# Patient Record
Sex: Male | Born: 1975 | Race: Black or African American | Hispanic: No | Marital: Single | Smoking: Former smoker
Health system: Southern US, Community
[De-identification: ages and names within clinical notes are randomized; demographics above are authoritative.]

## PROBLEM LIST (undated history)

## (undated) DIAGNOSIS — L309 Dermatitis, unspecified: Secondary | ICD-10-CM

## (undated) DIAGNOSIS — L409 Psoriasis, unspecified: Secondary | ICD-10-CM

---

## 2012-12-05 ENCOUNTER — Emergency Department (HOSPITAL_COMMUNITY)
Admission: EM | Admit: 2012-12-05 | Discharge: 2012-12-05 | Disposition: A | Discharge status: Home / Self Care | Payer: Self-pay | Attending: Emergency Medicine | Admitting: Emergency Medicine

## 2012-12-05 ENCOUNTER — Encounter (HOSPITAL_COMMUNITY): Payer: Self-pay | Admitting: *Deleted

## 2012-12-05 DIAGNOSIS — B86 Scabies: Secondary | ICD-10-CM | POA: Insufficient documentation

## 2012-12-05 MED ORDER — PERMETHRIN 5 % EX CREA: TOPICAL_CREAM | CUTANEOUS | Status: DC

## 2012-12-05 MED ORDER — HYDROXYZINE HCL 25 MG PO TABS: 25.0000 mg | ORAL_TABLET | Freq: Four times a day (QID) | ORAL | Status: DC

## 2012-12-05 NOTE — ED Notes (Signed)
Rash, itching, redness since October 2013 while incarcerated and diagnosed in January and received 2 oral treatments with last dose 2 weeks ago 11/16/12. Recurrence of sx in  Last week with increased itching, redness all over from head to toe.

## 2012-12-05 NOTE — ED Provider Notes (Signed)
Medical screening examination/treatment/procedure(s) were performed by non-physician practitioner and as supervising physician I was immediately available for consultation/collaboration.  Olivia Mackie, MD 12/05/12 [phone]

## 2012-12-05 NOTE — ED Provider Notes (Signed)
History     CSN: [id number]  Arrival date & time 12/05/12  4098   First MD Initiated Contact with Patient 12/05/12 [phone]      Chief Complaint  Patient presents with  . Pruritis    (Consider location/radiation/quality/duration/timing/severity/associated sxs/prior treatment) HPI  Brian Krause is a 37 y.o. male complaining of diffuse itching starting several months ago while in jail. Patient was recently diagnosed with scabies and received 2 oral treatments. His recurrence of symptoms in the last week. Patient reports a worsening at nighttime. Denies any new exposures, fever, cough, chest pain, abdominal pain, nausea vomiting, change of bowel or bladder habits.  No past medical history on file.  No past surgical history on file.  No family history on file.  History  Substance Use Topics  . Smoking status: Not on file  . Smokeless tobacco: Not on file  . Alcohol Use: Not on file      Review of Systems  Constitutional: Negative for fever.  Respiratory: Negative for shortness of breath.   Cardiovascular: Negative for chest pain.  Gastrointestinal: Negative for nausea, vomiting, abdominal pain and diarrhea.  Skin: Positive for wound.  All other systems reviewed and are negative.    Allergies  Review of patient's allergies indicates no known allergies.  Home Medications  No current outpatient prescriptions on file.  BP 172/74  Pulse 94  Temp 98.7 F (37.1 C) (Oral)  SpO2 97%  Physical Exam  Nursing note and vitals reviewed. Constitutional: He is oriented to person, place, and time. He appears well-developed and well-nourished. No distress.  HENT:  Head: Normocephalic.  Eyes: Conjunctivae normal and EOM are normal.  Neck: Normal range of motion. Neck supple.  Cardiovascular: Normal rate.   Pulmonary/Chest: Effort normal. No stridor.  Abdominal: Soft. Bowel sounds are normal.  Musculoskeletal: Normal range of motion.  Neurological: He is alert and oriented to  person, place, and time.  Skin:       Very dry and flaking in general. Multiple areas of excoriation with no signs of infection. Several linear burrows noted.   Psychiatric: He has a normal mood and affect.    ED Course  Procedures (including critical care time)  Labs Reviewed - No data to display No results found.   1. Scabies infestation       MDM   Pt verbalized understanding and agrees with care plan. Outpatient follow-up and return precautions given.    New Prescriptions   HYDROXYZINE (ATARAX/VISTARIL) 25 MG TABLET    Take 1 tablet (25 mg total) by mouth every 6 (six) hours.   PERMETHRIN (ELIMITE) 5 % CREAM    Apply to from neck to toes, even under fingernails. Apply before bedtime, leave on 12 hours and rinse off in the morning. If you are still itching in 14 days from application, please repeat          Wynetta Emery, PA-C 12/05/12 [phone]

## 2014-04-03 ENCOUNTER — Encounter (HOSPITAL_COMMUNITY): Payer: Self-pay | Admitting: Emergency Medicine

## 2014-04-03 ENCOUNTER — Emergency Department (HOSPITAL_COMMUNITY)
Admission: EM | Admit: 2014-04-03 | Discharge: 2014-04-03 | Disposition: A | Discharge status: Home / Self Care | Payer: Self-pay | Attending: Emergency Medicine | Admitting: Emergency Medicine

## 2014-04-03 DIAGNOSIS — R21 Rash and other nonspecific skin eruption: Secondary | ICD-10-CM | POA: Insufficient documentation

## 2014-04-03 DIAGNOSIS — L089 Local infection of the skin and subcutaneous tissue, unspecified: Secondary | ICD-10-CM | POA: Insufficient documentation

## 2014-04-03 DIAGNOSIS — M7989 Other specified soft tissue disorders: Secondary | ICD-10-CM | POA: Insufficient documentation

## 2014-04-03 DIAGNOSIS — Z87891 Personal history of nicotine dependence: Secondary | ICD-10-CM | POA: Insufficient documentation

## 2014-04-03 MED ORDER — SILVER SULFADIAZINE 1 % EX CREA
TOPICAL_CREAM | Freq: Once | CUTANEOUS | Status: AC
  Administered 2014-04-03: 1 via TOPICAL
  Filled 2014-04-03: qty 50

## 2014-04-03 MED ORDER — CEPHALEXIN 250 MG PO CAPS
250.0000 mg | ORAL_CAPSULE | Freq: Once | ORAL | Status: AC
  Administered 2014-04-03: 250 mg via ORAL
  Filled 2014-04-03: qty 1

## 2014-04-03 MED ORDER — SILVER SULFADIAZINE 1 % EX CREA: 1.0000 "application " | TOPICAL_CREAM | Freq: Two times a day (BID) | CUTANEOUS | Status: DC

## 2014-04-03 MED ORDER — CEPHALEXIN 500 MG PO CAPS: 500.0000 mg | ORAL_CAPSULE | Freq: Four times a day (QID) | ORAL | Status: DC

## 2014-04-03 NOTE — ED Provider Notes (Signed)
Medical screening examination/treatment/procedure(s) were performed by non-physician practitioner and as supervising physician I was immediately available for consultation/collaboration.   EKG Interpretation None      Devoria Albe, MD, Armando Gang   Ward Givens, MD 04/03/14 [phone]

## 2014-04-03 NOTE — Discharge Instructions (Signed)
Wound Care Wound care helps prevent pain and infection.  You may need a tetanus shot if:  You cannot remember when you had your last tetanus shot.  You have never had a tetanus shot.  The injury broke your skin. If you need a tetanus shot and you choose not to have one, you may get tetanus. Sickness from tetanus can be serious. HOME CARE   Only take medicine as told by your doctor.  Clean the wound daily with mild soap and water.  Change any bandages (dressings) as told by your doctor.  Put medicated cream and a bandage on the wound as told by your doctor.  Change the bandage if it gets wet, dirty, or starts to smell.  Take showers. Do not take baths, swim, or do anything that puts your wound under water.  Rest and raise (elevate) the wound until the pain and puffiness (swelling) are better.  Keep all doctor visits as told. GET HELP RIGHT AWAY IF:   Yellowish-white fluid (pus) comes from the wound.  Medicine does not lessen your pain.  There is a red streak going away from the wound.  You have a fever. MAKE SURE YOU:   Understand these instructions.  Will watch your condition.  Will get help right away if you are not doing well or get worse. Document Released: 08/03/2008 Document Revised: 01/17/2012 Document Reviewed: 02/28/2011 Exit[employer] Patient Information 2014 [employer].  

## 2014-04-03 NOTE — Progress Notes (Signed)
[facility] provided pt with a list of primary care resources and a [facility] [employer] Card application to help patient establish a pcp.  °

## 2014-04-03 NOTE — ED Provider Notes (Signed)
CSN: [id number]     Arrival date & time 04/03/14  1351 History  This chart was scribed for non-physician practitioner, Elpidio Anis, PA-C working with Ward Givens, MD by Greggory Stallion, ED scribe. This patient was seen in room WTR7/WTR7 and the patient's care was started at 2:42 PM.   Chief Complaint  Patient presents with  . Wound Infection   The history is provided by the patient. No language interpreter was used.   HPI Comments: Brian Krause is a 38 y.o. 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 He states it has been draining for the last 2 months and gradually worsened. It has gone from clear, to yellow, to green. States he also has swelling and pain in his right leg.   History reviewed. No pertinent past medical history. History reviewed. No pertinent past surgical history. No family history on file. History  Substance Use Topics  . Smoking status: Former Games developer  . Smokeless tobacco: Not on file  . Alcohol Use: Yes    Review of Systems  Cardiovascular: Positive for leg swelling.  Musculoskeletal: Positive for myalgias.  Skin: Positive for rash.  All other systems reviewed and are negative.  Allergies  Review of patient's allergies indicates no known allergies.  Home Medications   Prior to Admission medications   Medication Sig Start Date End Date Taking? Authorizing Provider  hydrOXYzine (ATARAX/VISTARIL) 25 MG tablet Take 1 tablet (25 mg total) by mouth every 6 (six) hours. 12/05/12   Nicole Pisciotta, PA-C  permethrin (ELIMITE) 5 % cream Apply to from neck to toes, even under fingernails. Apply before bedtime, leave on 12 hours and rinse off in the morning. If you are still itching in 14 days from application, please repeat 12/05/12   Joni Reining Pisciotta, PA-C   BP 124/73  Pulse 86  Temp(Src) 98.6 F (37 C) (Oral)  Resp 18  SpO2 100%  Physical Exam  Nursing note and vitals reviewed. Constitutional: He is oriented to  person, place, and time. He appears well-developed and well-nourished. No distress.  HENT:  Head: Normocephalic and atraumatic.  Eyes: EOM are normal.  Neck: Neck supple. No tracheal deviation present.  Cardiovascular: Normal rate.   Pulmonary/Chest: Effort normal. No respiratory distress.  Musculoskeletal: Normal range of motion.  Neurological: He is alert and oriented to person, place, and time.  Skin: Skin is warm and dry.  Extensive, shallow bilateral lower extremity ulcerations over posterior and lateral legs. No bleeding. Malodorous. There is minimal musculoskeletal swelling. No redness to calves at site of ulcerations or extending proximally.  Psychiatric: He has a normal mood and affect. His behavior is normal.    ED Course  Procedures (including critical care time)  DIAGNOSTIC STUDIES: Oxygen Saturation is 100% on RA, normal by my interpretation.    COORDINATION OF CARE: 2:45 PM-Discu  Labs Review Labs Reviewed - No data to display  Imaging Review No results found.   EKG Interpretation None      MDM   Final diagnoses:  None    1. Lower extremity ulcerations  Proper wound care instructions given. Wounds cleaned and re-bandaged. Silvadene and oral abx given. He is instructed to return in 2 days for recheck.   I personally performed the services described in this documentation, which was scribed in my presence. The recorded information has been reviewed and is accurate.  Arnoldo Hooker, PA-C 04/03/14 1554

## 2014-04-03 NOTE — ED Notes (Addendum)
Pt reports that he has had an psoriasis to the left lower leg, which has been draining for the past three months. Pt states the drainage has progressed from clear, to yellow, to green. Pt has not been evaluated for this before. Pt now reports right leg swelling and pain, which has same drainage as the left. Pt presents with bilateral legs dressed in gauze, which are visibly soiled with drainage. Pt states he is unable to bear weight on the right leg. Pt is A/O x4 and vitals are WDL.

## 2017-04-08 ENCOUNTER — Emergency Department (HOSPITAL_BASED_OUTPATIENT_CLINIC_OR_DEPARTMENT_OTHER)
Admission: EM | Admit: 2017-04-08 | Discharge: 2017-04-08 | Disposition: A | Discharge status: Home / Self Care | Payer: Self-pay | Attending: Emergency Medicine | Admitting: Emergency Medicine

## 2017-04-08 ENCOUNTER — Encounter (HOSPITAL_BASED_OUTPATIENT_CLINIC_OR_DEPARTMENT_OTHER): Payer: Self-pay | Admitting: Emergency Medicine

## 2017-04-08 ENCOUNTER — Emergency Department (HOSPITAL_BASED_OUTPATIENT_CLINIC_OR_DEPARTMENT_OTHER): Payer: Self-pay

## 2017-04-08 DIAGNOSIS — Y939 Activity, unspecified: Secondary | ICD-10-CM | POA: Insufficient documentation

## 2017-04-08 DIAGNOSIS — Y929 Unspecified place or not applicable: Secondary | ICD-10-CM | POA: Insufficient documentation

## 2017-04-08 DIAGNOSIS — Z87891 Personal history of nicotine dependence: Secondary | ICD-10-CM | POA: Insufficient documentation

## 2017-04-08 DIAGNOSIS — S8392XA Sprain of unspecified site of left knee, initial encounter: Secondary | ICD-10-CM | POA: Insufficient documentation

## 2017-04-08 DIAGNOSIS — X500XXA Overexertion from strenuous movement or load, initial encounter: Secondary | ICD-10-CM | POA: Insufficient documentation

## 2017-04-08 DIAGNOSIS — Y999 Unspecified external cause status: Secondary | ICD-10-CM | POA: Insufficient documentation

## 2017-04-08 MED ORDER — IBUPROFEN 600 MG PO TABS: 600.0000 mg | ORAL_TABLET | Freq: Four times a day (QID) | ORAL | 0 refills | Status: AC | PRN

## 2017-04-08 NOTE — ED Notes (Signed)
PMS intact before and after. Pt tolerated well. All questions answered. 

## 2017-04-08 NOTE — ED Triage Notes (Signed)
PT presents to ED with c/o left knee pain for 2 days without injury.

## 2017-04-08 NOTE — ED Provider Notes (Signed)
[facility]-EMERGENCY DEPT Provider Note   CSN: [id number] Arrival date & time: 04/08/17  2008  By signing my name below, I, Cynda AcresHailei Fulton, attest that this documentation has been prepared under the direction and in the presence of Arby BarrettePfeiffer, Margaree Sandhu, MD. Electronically Signed: Cynda AcresHailei Fulton, Scribe. 04/08/17. 9:21 PM.  History   Chief Complaint Chief Complaint  Patient presents with  . Knee Pain   HPI Comments: Brian Krause is a 41 y.o. male with no pertinent past medical history, who presents to the Emergency Department complaining of sudden-onset, constant left knee pain that began two days ago. Patient stets he went to sleep on an air mattress, he woke up and began having pain to the left knee. Patient states his pain progressively worsened yesterday. Patient reports applying icy/hot and ice. Patient is ambulatory in the emergency department. Patient denies any numbness, weakness, nausea, vomiting, fever, or chills.   The history is provided by the patient. No language interpreter was used.    History reviewed. No pertinent past medical history.  There are no active problems to display for this patient.   History reviewed. No pertinent surgical history.     Home Medications    Prior to Admission medications   Medication Sig Start Date End Date Taking? Authorizing Provider  cephALEXin (KEFLEX) 500 MG capsule Take 1 capsule (500 mg total) by mouth 4 (four) times daily. 04/03/14   Elpidio AnisUpstill, Shari, PA-C  HYDROCODONE-ACETAMINOPHEN PO Take 1 tablet by mouth once.    [provider]  ibuprofen (ADVIL,MOTRIN) 600 MG tablet Take 1 tablet (600 mg total) by mouth every 6 (six) hours as needed. 04/08/17   Arby BarrettePfeiffer, Zala Degrasse, MD  silver sulfADIAZINE (SILVADENE) 1 % cream Apply 1 application topically 2 (two) times daily. 04/03/14   Elpidio AnisUpstill, Shari, PA-C    Family History No family history on file.  Social History Social History  Substance Use Topics  . Smoking status: Former Games developermoker  .  Smokeless tobacco: Not on file  . Alcohol use Yes     Allergies   Patient has no known allergies.   Review of Systems Review of Systems  Constitutional: Negative for chills and fever.  Gastrointestinal: Negative for nausea and vomiting.  Musculoskeletal: Positive for arthralgias (left knee). Negative for gait problem and joint swelling.  Neurological: Negative for weakness.     Physical Exam Updated Vital Signs BP 115/79 (BP Location: Right Arm)   Pulse 84   Temp 98.3 F (36.8 C) (Oral)   Resp 16   SpO2 98%   Physical Exam  Constitutional: He is oriented to person, place, and time. He appears well-developed.  HENT:  Head: Normocephalic and atraumatic.  Mouth/Throat: Oropharynx is clear and moist.  Eyes: Conjunctivae and EOM are normal. Pupils are equal, round, and reactive to light.  Neck: Normal range of motion. Neck supple.  Cardiovascular: Normal rate and regular rhythm.   Pulmonary/Chest: Effort normal and breath sounds normal.  Abdominal: Soft. Bowel sounds are normal.  Musculoskeletal: Normal range of motion. He exhibits tenderness. He exhibits no edema or deformity.  No joint effusion. Pain with ROM beyond 45 degrees. No peripheral edema. Calf is soft and non-tender. No erythema. Skin is normal. No abrasions. Non-tender in the popliteal fossa.   Neurological: He is alert and oriented to person, place, and time.  Skin: Skin is warm and dry.  Psychiatric: He has a normal mood and affect.  Nursing note and vitals reviewed.    ED Treatments / Results  DIAGNOSTIC STUDIES: Oxygen Saturation  is 100% on RA, normal by my interpretation.    COORDINATION OF CARE: 9:21 PM Discussed treatment plan with pt at bedside and pt agreed to plan, which includes a knee brace.    Labs (all labs ordered are listed, but only abnormal results are displayed) Labs Reviewed - No data to display  EKG  EKG Interpretation None       Radiology No results  found.  Procedures Procedures (including critical care time)  Medications Ordered in ED Medications - No data to display   Initial Impression / Assessment and Plan / ED Course  I have reviewed the triage vital signs and the nursing notes.  Pertinent labs & imaging results that were available during my care of the patient were reviewed by me and considered in my medical decision making (see chart for details).      Final Clinical Impressions(s) / ED Diagnoses   Final diagnoses:  Sprain of left knee, unspecified ligament, initial encounter    New Prescriptions Discharge Medication List as of 04/08/2017  9:18 PM    START taking these medications   Details  ibuprofen (ADVIL,MOTRIN) 600 MG tablet Take 1 tablet (600 mg total) by mouth every 6 (six) hours as needed., Starting Fri 04/08/2017, Print           Arby Barrette, MD 04/13/17 1601

## 2017-04-08 NOTE — ED Notes (Signed)
EDP into room, prior to RN assessment, see MD notes, orders received and initated.  Alert, NAD, calm, interactive, resps e/u, speaking in clear complete sentences, no dyspnea noted, skin W&D, VSS, c/o L knee pain, onset Wednesday morning, some relief with advil, no known injury, waits tables for work (denies: fall, injury, numbness, tingling, weakness, fever, heat to knee, swelling, sob, nausea, dizziness or visual changes).   CMS, ROM, skin intact.

## 2017-08-23 ENCOUNTER — Emergency Department (HOSPITAL_COMMUNITY)
Admission: EM | Admit: 2017-08-23 | Discharge: 2017-08-23 | Disposition: A | Discharge status: Home / Self Care | Payer: Self-pay | Attending: Emergency Medicine | Admitting: Emergency Medicine

## 2017-08-23 ENCOUNTER — Encounter (HOSPITAL_COMMUNITY): Payer: Self-pay | Admitting: *Deleted

## 2017-08-23 DIAGNOSIS — Z87891 Personal history of nicotine dependence: Secondary | ICD-10-CM | POA: Insufficient documentation

## 2017-08-23 DIAGNOSIS — Z202 Contact with and (suspected) exposure to infections with a predominantly sexual mode of transmission: Secondary | ICD-10-CM | POA: Insufficient documentation

## 2017-08-23 DIAGNOSIS — R300 Dysuria: Secondary | ICD-10-CM | POA: Insufficient documentation

## 2017-08-23 DIAGNOSIS — Z79899 Other long term (current) drug therapy: Secondary | ICD-10-CM | POA: Insufficient documentation

## 2017-08-23 LAB — URINALYSIS, ROUTINE W REFLEX MICROSCOPIC
Bilirubin Urine: NEGATIVE
GLUCOSE, UA: NEGATIVE mg/dL
Ketones, ur: NEGATIVE mg/dL
Leukocytes, UA: NEGATIVE
Nitrite: NEGATIVE
PH: 5 (ref 5.0–8.0)
Protein, ur: NEGATIVE mg/dL
SPECIFIC GRAVITY, URINE: 1.014 (ref 1.005–1.030)

## 2017-08-23 MED ORDER — CEFTRIAXONE SODIUM 250 MG IJ SOLR
250.0000 mg | Freq: Once | INTRAMUSCULAR | Status: AC
  Administered 2017-08-23: 250 mg via INTRAMUSCULAR
  Filled 2017-08-23: qty 250

## 2017-08-23 MED ORDER — AZITHROMYCIN 250 MG PO TABS
1000.0000 mg | ORAL_TABLET | Freq: Once | ORAL | Status: AC
  Administered 2017-08-23: 1000 mg via ORAL
  Filled 2017-08-23: qty 4

## 2017-08-23 MED ORDER — LIDOCAINE HCL (PF) 1 % IJ SOLN
INTRAMUSCULAR | Status: AC
  Administered 2017-08-23: 5 mL
  Filled 2017-08-23: qty 5

## 2017-08-23 NOTE — ED Triage Notes (Addendum)
To ED for treatment after being made aware he was exposed to gonorrhea. No symptoms until noticed some burning this am with urination

## 2017-08-23 NOTE — ED Provider Notes (Signed)
[facility] EMERGENCY DEPARTMENT Provider Note   CSN: [id number] Arrival date & time: 08/23/17  1552     History   Chief Complaint Chief Complaint  Patient presents with  . Exposure to STD    HPI Brian Krause is a 41 y.o. male.  HPI 41 year old African-American male with no significant past medical history presents to the ED for treatment of STD. Patient states that his sexual partner made him aware that she was diagnosed and treated for gonorrhea. Patient denies any penile discharge or testicular pain or swelling. States that he is had no symptoms until he noticed some burning this a.m with urination after he was told he was exposed to gonorrhea.patient has sex active with multiple partners. Denies using protection. Denies any associated abdominal pain, nausea, emesis, fever. History reviewed. No pertinent past medical history.  There are no active problems to display for this patient.   History reviewed. No pertinent surgical history.     Home Medications    Prior to Admission medications   Medication Sig Start Date End Date Taking? Authorizing Provider  cephALEXin (KEFLEX) 500 MG capsule Take 1 capsule (500 mg total) by mouth 4 (four) times daily. 04/03/14   Elpidio Anis, PA-C  HYDROCODONE-ACETAMINOPHEN PO Take 1 tablet by mouth once.    [provider]  ibuprofen (ADVIL,MOTRIN) 600 MG tablet Take 1 tablet (600 mg total) by mouth every 6 (six) hours as needed. 04/08/17   Arby Barrette, MD  silver sulfADIAZINE (SILVADENE) 1 % cream Apply 1 application topically 2 (two) times daily. 04/03/14   Elpidio Anis, PA-C    Family History No family history on file.  Social History Social History  Substance Use Topics  . Smoking status: Former Games developer  . Smokeless tobacco: Not on file  . Alcohol use Yes     Allergies   Patient has no known allergies.   Review of Systems Review of Systems  Constitutional: Negative for chills and fever.    Gastrointestinal: Negative for abdominal pain, nausea and vomiting.  Genitourinary: Positive for dysuria. Negative for discharge, flank pain, hematuria, penile pain, penile swelling, scrotal swelling, testicular pain and urgency.  Skin: Negative for rash.     Physical Exam Updated Vital Signs BP 112/77 (BP Location: Right Arm)   Pulse 75   Temp 98.1 F (36.7 C) (Oral)   Resp 16   Ht  (1.651 m)   Wt 61.7 kg (136 lb)   SpO2 100%   BMI 22.63 kg/m   Physical Exam  Constitutional: He appears well-developed and well-nourished. No distress.  HENT:  Head: Normocephalic and atraumatic.  Eyes: Right eye exhibits no discharge. Left eye exhibits no discharge. No scleral icterus.  Neck: Normal range of motion.  Pulmonary/Chest: No respiratory distress.  Abdominal: Soft. Bowel sounds are normal. There is no tenderness. There is no rebound and no guarding.  Genitourinary:  Genitourinary Comments: Chaperone present for exam. Circumcised male. No penile discharge, erythema, tenderness, lesion, or rash. 2 descended testes without swelling, pain, lesions or rash. No inguinal lymphadenopathy or hernia.    Musculoskeletal: Normal range of motion.  Neurological: He is alert.  Skin: No pallor.  Psychiatric: His behavior is normal. Judgment and thought content normal.  Nursing note and vitals reviewed.    ED Treatments / Results  Labs (all labs ordered are listed, but only abnormal results are displayed) Labs Reviewed  URINALYSIS, ROUTINE W REFLEX MICROSCOPIC  GC/CHLAMYDIA PROBE AMP ([place]) NOT AT [facility]  EKG  EKG Interpretation None       Radiology No results found.  Procedures Procedures (including critical care time)  Medications Ordered in ED Medications  cefTRIAXone (ROCEPHIN) injection 250 mg (not administered)  azithromycin (ZITHROMAX) tablet 1,000 mg (not administered)     Initial Impression / Assessment and Plan / ED Course  I have reviewed the triage  vital signs and the nursing notes.  Pertinent labs & imaging results that were available during my care of the patient were reviewed by me and considered in my medical decision making (see chart for details).     Patient treated in the ED for STI with azithromycin and Rocephin. Patient does report some dysuria. UA shows raer bacteria. Doubt uti will send for culture. .Patient advised to inform and treat all sexual partners. Pt advised on safe sex practices and understands that they have GC/Chlamydia cultures pending and will result in 2-3 days. HIV and RPR declined by pt. Pt encouraged to follow up at local health department for future STI checks. No concern for prostatitis or epididymitis. Discussed return precautions. Pt appears safe for discharge.    Final Clinical Impressions(s) / ED Diagnoses   Final diagnoses:  STD exposure  Dysuria    New Prescriptions New Prescriptions   No medications on file     Wallace Keller 08/23/17 1840    Linwood Dibbles, MD 08/23/17 2307

## 2017-08-23 NOTE — Discharge Instructions (Signed)
Your urine shows no signs of infection at this time. Have sent for culture and if it grows something that needs to be treated he will be informed. You have been treated for gonorrhea and chlamydia in the ED. Your gonorrhea and chlamydia cultures are pending.Please inform all sexual partners that he was treated for STD. Avoid sexual contact for 14 days. May follow up the local health department for further testing and treatment of STDs. Perform safe sex practices.

## 2017-08-25 LAB — URINE CULTURE: Culture: NO GROWTH

## 2019-01-28 ENCOUNTER — Emergency Department (HOSPITAL_COMMUNITY)
Admission: EM | Admit: 2019-01-28 | Discharge: 2019-01-28 | Disposition: A | Discharge status: Home / Self Care | Payer: Self-pay | Attending: Emergency Medicine | Admitting: Emergency Medicine

## 2019-01-28 ENCOUNTER — Encounter (HOSPITAL_COMMUNITY): Payer: Self-pay | Admitting: Emergency Medicine

## 2019-01-28 ENCOUNTER — Emergency Department (HOSPITAL_COMMUNITY): Payer: Self-pay

## 2019-01-28 DIAGNOSIS — T148XXA Other injury of unspecified body region, initial encounter: Secondary | ICD-10-CM | POA: Insufficient documentation

## 2019-01-28 DIAGNOSIS — S022XXA Fracture of nasal bones, initial encounter for closed fracture: Secondary | ICD-10-CM | POA: Insufficient documentation

## 2019-01-28 DIAGNOSIS — Z23 Encounter for immunization: Secondary | ICD-10-CM | POA: Insufficient documentation

## 2019-01-28 DIAGNOSIS — T07XXXA Unspecified multiple injuries, initial encounter: Secondary | ICD-10-CM

## 2019-01-28 DIAGNOSIS — Y998 Other external cause status: Secondary | ICD-10-CM | POA: Insufficient documentation

## 2019-01-28 DIAGNOSIS — S31103A Unspecified open wound of abdominal wall, right lower quadrant without penetration into peritoneal cavity, initial encounter: Secondary | ICD-10-CM | POA: Insufficient documentation

## 2019-01-28 DIAGNOSIS — F10129 Alcohol abuse with intoxication, unspecified: Secondary | ICD-10-CM | POA: Insufficient documentation

## 2019-01-28 DIAGNOSIS — Y929 Unspecified place or not applicable: Secondary | ICD-10-CM | POA: Insufficient documentation

## 2019-01-28 DIAGNOSIS — Y939 Activity, unspecified: Secondary | ICD-10-CM | POA: Insufficient documentation

## 2019-01-28 LAB — ETHANOL: Alcohol, Ethyl (B): 224 mg/dL — ABNORMAL HIGH (ref <10)

## 2019-01-28 LAB — PREPARE FRESH FROZEN PLASMA
Unit division: 0
Unit division: 0

## 2019-01-28 LAB — CBC
HCT: 40.9 % (ref 39.0–52.0)
HEMOGLOBIN: 13 g/dL (ref 13.0–17.0)
MCH: 28.2 pg (ref 26.0–34.0)
MCHC: 31.8 g/dL (ref 30.0–36.0)
MCV: 88.7 fL (ref 80.0–100.0)
Platelets: 261 10*3/uL (ref 150–400)
RBC: 4.61 MIL/uL (ref 4.22–5.81)
RDW: 13 % (ref 11.5–15.5)
WBC: 6.9 10*3/uL (ref 4.0–10.5)
nRBC: 0 % (ref 0.0–0.2)

## 2019-01-28 LAB — BPAM FFP
BLOOD PRODUCT EXPIRATION DATE: 202004052359
Blood Product Expiration Date: 202004062359
ISSUE DATE / TIME: 202003220121
ISSUE DATE / TIME: 202003220121
Unit Type and Rh: 6200
Unit Type and Rh: 6200

## 2019-01-28 LAB — COMPREHENSIVE METABOLIC PANEL
ALT: 18 U/L (ref 0–44)
AST: 25 U/L (ref 15–41)
Albumin: 4.2 g/dL (ref 3.5–5.0)
Alkaline Phosphatase: 53 U/L (ref 38–126)
Anion gap: 10 (ref 5–15)
BUN: 9 mg/dL (ref 6–20)
CHLORIDE: 108 mmol/L (ref 98–111)
CO2: 24 mmol/L (ref 22–32)
Calcium: 9 mg/dL (ref 8.9–10.3)
Creatinine, Ser: 0.99 mg/dL (ref 0.61–1.24)
GFR calc Af Amer: >60 mL/min (ref >60)
GFR calc non Af Amer: >60 mL/min (ref >60)
Glucose, Bld: 122 mg/dL — ABNORMAL HIGH (ref 70–99)
POTASSIUM: 3.4 mmol/L — AB (ref 3.5–5.1)
Sodium: 142 mmol/L (ref 135–145)
Total Bilirubin: 0.5 mg/dL (ref 0.3–1.2)
Total Protein: 7.7 g/dL (ref 6.5–8.1)

## 2019-01-28 LAB — PROTIME-INR
INR: 1 (ref 0.8–1.2)
Prothrombin Time: 13.2 seconds (ref 11.4–15.2)

## 2019-01-28 LAB — CDS SEROLOGY

## 2019-01-28 LAB — LACTIC ACID, PLASMA: Lactic Acid, Venous: 4.6 mmol/L (ref 0.5–1.9)

## 2019-01-28 LAB — ABO/RH: ABO/RH(D): B POS

## 2019-01-28 MED ORDER — ONDANSETRON HCL 4 MG/2ML IJ SOLN
INTRAMUSCULAR | Status: AC
  Filled 2019-01-28: qty 2

## 2019-01-28 MED ORDER — IOHEXOL 300 MG/ML  SOLN
100.0000 mL | Freq: Once | INTRAMUSCULAR | Status: AC | PRN
  Administered 2019-01-28: 100 mL via INTRAVENOUS

## 2019-01-28 MED ORDER — TETANUS-DIPHTH-ACELL PERTUSSIS 5-2.5-18.5 LF-MCG/0.5 IM SUSP
0.5000 mL | Freq: Once | INTRAMUSCULAR | Status: AC
  Administered 2019-01-28: 0.5 mL via INTRAMUSCULAR
  Filled 2019-01-28: qty 0.5

## 2019-01-28 MED ORDER — SODIUM CHLORIDE 0.9 % IV BOLUS
1000.0000 mL | Freq: Once | INTRAVENOUS | Status: AC
  Administered 2019-01-28: 1000 mL via INTRAVENOUS

## 2019-01-28 MED ORDER — FENTANYL CITRATE (PF) 100 MCG/2ML IJ SOLN
INTRAMUSCULAR | Status: AC
  Filled 2019-01-28: qty 2

## 2019-01-28 MED ORDER — NAPROXEN 250 MG PO TABS
500.0000 mg | ORAL_TABLET | Freq: Once | ORAL | Status: AC
  Administered 2019-01-28: 500 mg via ORAL
  Filled 2019-01-28: qty 2

## 2019-01-28 MED ORDER — FENTANYL CITRATE (PF) 100 MCG/2ML IJ SOLN
50.0000 ug | Freq: Once | INTRAMUSCULAR | Status: AC
  Administered 2019-01-28: 50 ug via INTRAVENOUS

## 2019-01-28 MED ORDER — ONDANSETRON HCL 4 MG/2ML IJ SOLN
4.0000 mg | Freq: Once | INTRAMUSCULAR | Status: AC
  Administered 2019-01-28: 4 mg via INTRAVENOUS

## 2019-01-28 MED ORDER — IBUPROFEN 600 MG PO TABS: 600.0000 mg | ORAL_TABLET | Freq: Four times a day (QID) | ORAL | 0 refills | Status: AC | PRN

## 2019-01-28 MED ORDER — CEPHALEXIN 500 MG PO CAPS: 500.0000 mg | ORAL_CAPSULE | Freq: Four times a day (QID) | ORAL | 0 refills | Status: DC
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
## 2019-01-28 NOTE — ED Notes (Signed)
Patient transported to CT scan . 
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
## 2019-01-28 NOTE — ED Notes (Addendum)
EDP ( Dr. Rhunette Croft) explained tests results and discharge plan to pt.
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
## 2019-01-29 ENCOUNTER — Encounter (HOSPITAL_COMMUNITY): Payer: Self-pay | Admitting: *Deleted

## 2019-01-29 LAB — TYPE AND SCREEN
ABO/RH(D): B POS
ANTIBODY SCREEN: NEGATIVE
Unit division: 0
Unit division: 0

## 2019-01-29 LAB — BPAM RBC
Blood Product Expiration Date: 202003262359
Blood Product Expiration Date: 202003262359
ISSUE DATE / TIME: 202003220532
ISSUE DATE / TIME: 202003221202
Unit Type and Rh: 5100
Unit Type and Rh: 5100
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
## 2021-10-19 ENCOUNTER — Encounter (HOSPITAL_COMMUNITY): Payer: Self-pay

## 2021-10-19 ENCOUNTER — Other Ambulatory Visit: Payer: Self-pay

## 2021-10-19 ENCOUNTER — Ambulatory Visit (HOSPITAL_COMMUNITY)
Admission: EM | Admit: 2021-10-19 | Discharge: 2021-10-19 | Disposition: A | Discharge status: Home / Self Care | Payer: BC Managed Care – PPO | Attending: Emergency Medicine | Admitting: Emergency Medicine

## 2021-10-19 DIAGNOSIS — K047 Periapical abscess without sinus: Secondary | ICD-10-CM

## 2021-10-19 MED ORDER — AMOXICILLIN-POT CLAVULANATE 875-125 MG PO TABS: 1.0000 | ORAL_TABLET | Freq: Two times a day (BID) | ORAL | 0 refills | Status: AC

## 2021-10-19 NOTE — Discharge Instructions (Signed)
Take antibiotics as prescribed. Continue OTC medicine as needed for discomfort. Follow-up with dentist.

## 2021-10-19 NOTE — ED Provider Notes (Signed)
[facility]    CSN: [id number] Arrival date & time: 10/19/21  4818      History   Chief Complaint Chief Complaint  Patient presents with   Dental Pain    HPI Brian Krause is a 45 y.o. male.   Patient presents with concerns of tooth infection. The patient reports he first really noticed it on Thursday, with pain in his left lower back molar. He reports he was taking BC powders with improvement until today when the pain worsened. He reports worsened pain with chewing. He has had mild accompanying headache today. He denies fever, dizziness, difficulty swallowing or breathing. He reports prior similar with another tooth earlier in the year.   The history is provided by the patient.  Dental Pain Associated symptoms: headaches   Associated symptoms: no drooling, no facial swelling, no fever and no neck pain    History reviewed. No pertinent past medical history.  There are no problems to display for this patient.   History reviewed. No pertinent surgical history.     Home Medications    Prior to Admission medications   Medication Sig Start Date End Date Taking? Authorizing Provider  amoxicillin-clavulanate (AUGMENTIN) 875-125 MG tablet Take 1 tablet by mouth every 12 (twelve) hours. 10/19/21  Yes Adyson Vanburen L, PA  HYDROCODONE-ACETAMINOPHEN PO Take 1 tablet by mouth once.    [provider]  ibuprofen (ADVIL,MOTRIN) 600 MG tablet Take 1 tablet (600 mg total) by mouth every 6 (six) hours as needed. 04/08/17   Arby Barrette, MD  ibuprofen (ADVIL,MOTRIN) 600 MG tablet Take 1 tablet (600 mg total) by mouth every 6 (six) hours as needed. 01/28/19   Derwood Kaplan, MD  silver sulfADIAZINE (SILVADENE) 1 % cream Apply 1 application topically 2 (two) times daily. 04/03/14   Elpidio Anis, PA-C    Family History History reviewed. No pertinent family history.  Social History Social History   Tobacco Use   Smoking status: Former  Substance Use Topics    Alcohol use: Yes   Drug use: Yes    Types: Marijuana     Allergies   Patient has no known allergies.   Review of Systems Review of Systems  Constitutional:  Negative for fatigue and fever.  HENT:  Positive for dental problem. Negative for drooling, ear pain, facial swelling, sore throat, trouble swallowing and voice change.   Respiratory:  Negative for cough and shortness of breath.   Gastrointestinal:  Negative for nausea and vomiting.  Musculoskeletal:  Negative for neck pain.  Skin:  Negative for rash.  Neurological:  Positive for headaches.    Physical Exam Triage Vital Signs ED Triage Vitals  Enc Vitals Group     BP 10/19/21 1920 132/79     Pulse Rate 10/19/21 1920 100     Resp 10/19/21 1920 18     Temp --      Temp src --      SpO2 10/19/21 1920 97 %     Weight --      Height --      Head Circumference --      Peak Flow --      Pain Score 10/19/21 1919 8     Pain Loc --      Pain Edu? --      Excl. in GC? --    No data found.  Updated Vital Signs BP 132/79 (BP Location: Right Arm)   Pulse 100   Resp 18   SpO2 97%  Visual Acuity Right Eye Distance:   Left Eye Distance:   Bilateral Distance:    Right Eye Near:   Left Eye Near:    Bilateral Near:     Physical Exam Vitals and nursing note reviewed.  Constitutional:      General: He is not in acute distress. HENT:     Head: Normocephalic.     Mouth/Throat:     Dentition: Abnormal dentition.     Pharynx: Uvula midline. No pharyngeal swelling or uvula swelling.     Comments: Generalized poor dentition.  Left most posterior lower molar fractured/decayed. Mild gum swelling and erythema without clear abscess. Overlying tenderness. No visible cheek swelling. No submental swelling, trismus, or drooling.  Eyes:     Pupils: Pupils are equal, round, and reactive to light.  Cardiovascular:     Rate and Rhythm: Normal rate and regular rhythm.     Heart sounds: Normal heart sounds.  Pulmonary:      Effort: Pulmonary effort is normal.     Breath sounds: Normal breath sounds.  Lymphadenopathy:     Cervical: No cervical adenopathy.  Skin:    Findings: No rash.  Neurological:     Mental Status: He is alert.     Gait: Gait normal.  Psychiatric:        Mood and Affect: Mood normal.     UC Treatments / Results  Labs (all labs ordered are listed, but only abnormal results are displayed) Labs Reviewed - No data to display  EKG   Radiology No results found.  Procedures Procedures (including critical care time)  Medications Ordered in UC Medications - No data to display  Initial Impression / Assessment and Plan / UC Course  I have reviewed the triage vital signs and the nursing notes.  Pertinent labs & imaging results that were available during my care of the patient were reviewed by me and considered in my medical decision making (see chart for details).     Empiric abx for dental infection. No red flags. Discussed importance of follow-up with dentist & ER precautions.   E/M: 1 acute uncomplicated illness, no data, moderate risk due to prescription management  Final Clinical Impressions(s) / UC Diagnoses   Final diagnoses:  Dental infection     Discharge Instructions      Take antibiotics as prescribed. Continue OTC medicine as needed for discomfort. Follow-up with dentist.     ED Prescriptions     Medication Sig Dispense Auth. Provider   amoxicillin-clavulanate (AUGMENTIN) 875-125 MG tablet Take 1 tablet by mouth every 12 (twelve) hours. 14 tablet Vallery Sa, Anarosa Kubisiak L, [place]      PDMP not reviewed this encounter.   Estanislado Pandy, [place] 10/19/21 1945

## 2021-10-19 NOTE — ED Triage Notes (Signed)
Pt presents with c/o a tooth abscess x 5 days. States he had been taking BC powders. States it helped some and now it is not helping.

## 2023-12-30 ENCOUNTER — Other Ambulatory Visit (HOSPITAL_BASED_OUTPATIENT_CLINIC_OR_DEPARTMENT_OTHER): Payer: Self-pay

## 2023-12-30 ENCOUNTER — Encounter (HOSPITAL_BASED_OUTPATIENT_CLINIC_OR_DEPARTMENT_OTHER): Payer: Self-pay | Admitting: Emergency Medicine

## 2023-12-30 ENCOUNTER — Other Ambulatory Visit: Payer: Self-pay

## 2023-12-30 ENCOUNTER — Emergency Department (HOSPITAL_BASED_OUTPATIENT_CLINIC_OR_DEPARTMENT_OTHER)
Admission: EM | Admit: 2023-12-30 | Discharge: 2023-12-30 | Disposition: A | Discharge status: Home / Self Care | Payer: Self-pay | Attending: Emergency Medicine | Admitting: Emergency Medicine

## 2023-12-30 DIAGNOSIS — L409 Psoriasis, unspecified: Secondary | ICD-10-CM | POA: Insufficient documentation

## 2023-12-30 HISTORY — DX: Dermatitis, unspecified: L30.9

## 2023-12-30 HISTORY — DX: Psoriasis, unspecified: L40.9

## 2023-12-30 MED ORDER — TRIAMCINOLONE ACETONIDE 0.1 % EX CREA
1.0000 | TOPICAL_CREAM | Freq: Two times a day (BID) | CUTANEOUS | 0 refills | Status: AC
  Filled 2023-12-30: qty 45, 30d supply, fill #0

## 2023-12-30 NOTE — Discharge Instructions (Addendum)
 Your rash today does seem consistent with psoriasis.  Does not seem infected.  You have been prescribed triamcinolone cream.  Please apply this twice daily to your rash for the next 2 weeks.   Please keep your skin moisturized with unscented lotion such as Eucerin cream.  Please follow-up with a dermatologist if your rash does not seem to improve, or you have frequent recurrences of your rash.  Return to the ER if you have any unexplained fevers, rash in your mouth, any other new or concerning symptoms

## 2023-12-30 NOTE — ED Triage Notes (Signed)
 Pt POV steady gait- c/o full body rash x1 month, reports mild drainage to BL legs from scratching.    Denies new products, laundry detergent.   Hx of eczema, psoriasis.

## 2023-12-30 NOTE — ED Provider Notes (Addendum)
 [facility] Provider Note   CSN: [id number] Arrival date & time: 12/30/23  1605     History  Chief Complaint  Patient presents with   Rash    Brian Krause is a 48 y.o. male with history of eczema and psoriasis, presents with concern for dry itchy rash on his arms and legs.  States he thought it was his eczema or psoriasis, and started scratching at it.  States he tried to use the cream he had for yeast infection on it and it did not help with his rash.  Denies any new medications, soaps or lotions.   Rash      Home Medications Prior to Admission medications   Medication Sig Start Date End Date Taking? Authorizing Provider  triamcinolone cream (KENALOG) 0.1 % Apply 1 Application topically 2 (two) times daily for 7 days. 12/30/23 03/29/24 Yes Arabella Merles, PA-C  amoxicillin-clavulanate (AUGMENTIN) 875-125 MG tablet Take 1 tablet by mouth every 12 (twelve) hours. 10/19/21   Vallery Sa, Amy L, PA  HYDROCODONE-ACETAMINOPHEN PO Take 1 tablet by mouth once.    [provider]  ibuprofen (ADVIL,MOTRIN) 600 MG tablet Take 1 tablet (600 mg total) by mouth every 6 (six) hours as needed. 04/08/17   Arby Barrette, MD  ibuprofen (ADVIL,MOTRIN) 600 MG tablet Take 1 tablet (600 mg total) by mouth every 6 (six) hours as needed. 01/28/19   Derwood Kaplan, MD  silver sulfADIAZINE (SILVADENE) 1 % cream Apply 1 application topically 2 (two) times daily. 04/03/14   Elpidio Anis, PA-C      Allergies    Patient has no known allergies.    Review of Systems   Review of Systems  Skin:  Positive for rash.    Physical Exam Updated Vital Signs BP (!) 118/92 (BP Location: Left Arm)   Pulse 98   Temp 98.1 F (36.7 C) (Oral)   Resp 14   Ht 5\' 5"  (1.651 m)   Wt 62.1 kg   SpO2 97%   BMI 22.80 kg/m  Physical Exam Vitals and nursing note reviewed.  Constitutional:      Appearance: Normal appearance.  HENT:     Head: Atraumatic.   Cardiovascular:     Rate and Rhythm: Normal rate and regular rhythm.  Pulmonary:     Effort: Pulmonary effort is normal.  Skin:    Comments: Dry circular plaques on patient's arms and legs bilaterally.  No areas of fluctuance or pus drainage.  No surrounding erythema  No involvement of palms or soles, or mucosal surfaces.  Neurological:     General: No focal deficit present.     Mental Status: He is alert.  Psychiatric:        Mood and Affect: Mood normal.        Behavior: Behavior normal.     ED Results / Procedures / Treatments   Labs (all labs ordered are listed, but only abnormal results are displayed) Labs Reviewed - No data to display  EKG None  Radiology No results found.  Procedures Procedures    Medications Ordered in ED Medications - No data to display  ED Course/ Medical Decision Making/ A&P                                 Medical Decision Making Risk Prescription drug management.     Differential diagnosis includes but is not limited to psoriasis, eczema, contact  dermatitis, cellulitis  ED Course:  Patient well-appearing, stable vital signs.  Reports a dry itchy rash to his legs and arms over the past couple days.  He felt this was similar to his previous psoriasis flares, but did not have any steroid cream at home to treat it.  He reports he was itching at it and was concerned for infection.  However, on exam, dry plaques are noted on patient's arms and legs without any areas of erythema, edema, or pus drainage.  It does not look infected.  No involvement of the palms or soles, no concern for syphilis.  No involvement of mucosal surfaces, no skin peeling, no concern for SJS.  He denies any fevers, or other systemic symptoms, no recent medication changes. Low concern for DRESS. No concern for contact dermatitis as he denies any soaps, lotions, foods, medications.  Feel patient's rash today is consistent with psoriasis.  Will treat with triamcinolone  cream at home.  Stable and appropriate for discharge   Impression: Psoriasis  Disposition:  The patient was discharged home with instructions to apply triamcinolone cream twice daily to the affected areas for up to 2 weeks to treat rash.  Also discussed keeping skin moisturized with nonscented lotion such as Eucerin cream.  Follow-up with dermatology if rash does not improve, or if he continues getting flareups frequently. Return precautions given.            Final Clinical Impression(s) / ED Diagnoses Final diagnoses:  Psoriasis    Rx / DC Orders ED Discharge Orders          Ordered    triamcinolone cream (KENALOG) 0.1 %  2 times daily        12/30/23 1647              Arabella Merles, PA-C 12/30/23 1656    Arabella Merles, PA-C 12/30/23 1656    Pricilla Loveless, MD 12/30/23 [phone]

## 2023-12-30 NOTE — ED Notes (Signed)
 Patient given discharge instructions. Questions were answered. Patient verbalized understanding of discharge instructions and care at home.

## 2024-08-13 ENCOUNTER — Emergency Department (HOSPITAL_BASED_OUTPATIENT_CLINIC_OR_DEPARTMENT_OTHER)
Admission: EM | Admit: 2024-08-13 | Discharge: 2024-08-13 | Disposition: A | Discharge status: Home / Self Care | Payer: Self-pay | Attending: Emergency Medicine | Admitting: Emergency Medicine

## 2024-08-13 ENCOUNTER — Emergency Department (HOSPITAL_BASED_OUTPATIENT_CLINIC_OR_DEPARTMENT_OTHER): Payer: Self-pay

## 2024-08-13 ENCOUNTER — Other Ambulatory Visit: Payer: Self-pay

## 2024-08-13 ENCOUNTER — Encounter (HOSPITAL_BASED_OUTPATIENT_CLINIC_OR_DEPARTMENT_OTHER): Payer: Self-pay | Admitting: Emergency Medicine

## 2024-08-13 DIAGNOSIS — K2100 Gastro-esophageal reflux disease with esophagitis, without bleeding: Secondary | ICD-10-CM | POA: Insufficient documentation

## 2024-08-13 DIAGNOSIS — R10A3 Flank pain, bilateral: Secondary | ICD-10-CM

## 2024-08-13 DIAGNOSIS — R1032 Left lower quadrant pain: Secondary | ICD-10-CM

## 2024-08-13 LAB — URINALYSIS, W/ REFLEX TO CULTURE (INFECTION SUSPECTED)
Bilirubin Urine: NEGATIVE
Glucose, UA: NEGATIVE mg/dL
Ketones, ur: NEGATIVE mg/dL
Leukocytes,Ua: NEGATIVE
Nitrite: NEGATIVE
Protein, ur: NEGATIVE mg/dL
Specific Gravity, Urine: 1.025 (ref 1.005–1.030)
pH: 7 (ref 5.0–8.0)

## 2024-08-13 LAB — CBC
HCT: 37.8 % — ABNORMAL LOW (ref 39.0–52.0)
Hemoglobin: 12.6 g/dL — ABNORMAL LOW (ref 13.0–17.0)
MCH: 28.4 pg (ref 26.0–34.0)
MCHC: 33.3 g/dL (ref 30.0–36.0)
MCV: 85.1 fL (ref 80.0–100.0)
Platelets: 259 K/uL (ref 150–400)
RBC: 4.44 MIL/uL (ref 4.22–5.81)
RDW: 13.3 % (ref 11.5–15.5)
WBC: 5.7 K/uL (ref 4.0–10.5)
nRBC: 0 % (ref 0.0–0.2)

## 2024-08-13 LAB — COMPREHENSIVE METABOLIC PANEL WITH GFR
ALT: 13 U/L (ref 0–44)
AST: 24 U/L (ref 15–41)
Albumin: 4.5 g/dL (ref 3.5–5.0)
Alkaline Phosphatase: 72 U/L (ref 38–126)
Anion gap: 11 (ref 5–15)
BUN: 12 mg/dL (ref 6–20)
CO2: 26 mmol/L (ref 22–32)
Calcium: 9.4 mg/dL (ref 8.9–10.3)
Chloride: 102 mmol/L (ref 98–111)
Creatinine, Ser: 0.93 mg/dL (ref 0.61–1.24)
GFR, Estimated: >60 mL/min (ref >60)
Glucose, Bld: 99 mg/dL (ref 70–99)
Potassium: 3.6 mmol/L (ref 3.5–5.1)
Sodium: 138 mmol/L (ref 135–145)
Total Bilirubin: 0.3 mg/dL (ref 0.0–1.2)
Total Protein: 7.4 g/dL (ref 6.5–8.1)

## 2024-08-13 LAB — LIPASE, BLOOD: Lipase: 20 U/L (ref 11–51)

## 2024-08-13 MED ORDER — IOHEXOL 300 MG/ML  SOLN
75.0000 mL | Freq: Once | INTRAMUSCULAR | Status: AC | PRN
  Administered 2024-08-13: 75 mL via INTRAVENOUS

## 2024-08-13 MED ORDER — PANTOPRAZOLE SODIUM 20 MG PO TBEC: 40.0000 mg | DELAYED_RELEASE_TABLET | Freq: Every day | ORAL | 0 refills | Status: AC

## 2024-08-13 MED ORDER — TRIAMCINOLONE ACETONIDE 0.5 % EX OINT: 1.0000 | TOPICAL_OINTMENT | Freq: Two times a day (BID) | CUTANEOUS | 0 refills | Status: AC

## 2024-08-13 NOTE — ED Provider Notes (Signed)
 [place] [facility] Provider Note   CSN: [id number] Arrival date & time: 08/13/24  1512     Patient presents with: multiple complaints   Brian Krause is a 48 y.o. male.  {Add pertinent medical, surgical, social history, OB history to HPI:32947} HPI     Kidneys hurting, night before last both sides/flanks looked swollen Sometimes will wake up at night hurting so badly will put heating pad A few months off and on, night before last worse again.  No hx of kidney stones, grandmother passed of kidney failure and someone else in family had kidney problems as well Dysuria a week ago Discharge, about 1 week Treated for chl/gc--test was negative, but treated for it in the summer time. Right now is 3-4/10, other night was so severe almost wanted to faint A few weeks ago felt like had diarrhea, not sure if what he was eating, has not had it since then , no constipation No nausea or vomiting   Then took urinary tract infection test, turned pink and thought it was that Girlfriend thinking had UTI, had been getting, having urethritis.  Came in last time about skin, had asked for %, instead of 1, now getting triamcinolone , wanting decreased dose    Past Medical History:  Diagnosis Date   Eczema    Psoriasis     Prior to Admission medications   Medication Sig Start Date End Date Taking? Authorizing Provider  amoxicillin -clavulanate (AUGMENTIN ) 875-125 MG tablet Take 1 tablet by mouth every 12 (twelve) hours. 10/19/21   Vear, Amy L, PA  HYDROCODONE-ACETAMINOPHEN PO Take 1 tablet by mouth once.    [provider]  ibuprofen  (ADVIL ,MOTRIN ) 600 MG tablet Take 1 tablet (600 mg total) by mouth every 6 (six) hours as needed. 04/08/17   Armenta Canning, MD  ibuprofen  (ADVIL ,MOTRIN ) 600 MG tablet Take 1 tablet (600 mg total) by mouth every 6 (six) hours as needed. 01/28/19   Charlyn Sora, MD  silver  sulfADIAZINE  (SILVADENE ) 1 % cream Apply 1  application topically 2 (two) times daily. 04/03/14   Odell Balls, PA-C    Allergies: Patient has no known allergies.    Review of Systems  Updated Vital Signs BP 129/75 (BP Location: Right Arm)   Pulse 73   Temp 98.6 F (37 C)   Resp 18   Ht 5' 5 (1.651 m)   Wt 62.1 kg   SpO2 96%   BMI 22.80 kg/m   Physical Exam Vitals and nursing note reviewed.  Constitutional:      General: He is not in acute distress.    Appearance: He is well-developed. He is not diaphoretic.  HENT:     Head: Normocephalic and atraumatic.  Eyes:     Conjunctiva/sclera: Conjunctivae normal.  Cardiovascular:     Rate and Rhythm: Normal rate and regular rhythm.     Heart sounds: Normal heart sounds. No murmur heard.    No friction rub. No gallop.  Pulmonary:     Effort: Pulmonary effort is normal. No respiratory distress.     Breath sounds: Normal breath sounds. No wheezing or rales.  Abdominal:     General: There is no distension.     Palpations: Abdomen is soft.     Tenderness: There is abdominal tenderness (LLQ, left flank). There is no guarding.  Musculoskeletal:     Cervical back: Normal range of motion.  Skin:    General: Skin is warm and dry.     Comments: 1-2  dry papules on plantar surface of foot  Neurological:     Mental Status: He is alert and oriented to person, place, and time.     (all labs ordered are listed, but only abnormal results are displayed) Labs Reviewed  CBC - Abnormal; Notable for the following components:      Result Value   Hemoglobin 12.6 (*)    HCT 37.8 (*)    All other components within normal limits  URINALYSIS, W/ REFLEX TO CULTURE (INFECTION SUSPECTED) - Abnormal; Notable for the following components:   Hgb urine dipstick TRACE (*)    Bacteria, UA RARE (*)    All other components within normal limits  LIPASE, BLOOD  COMPREHENSIVE METABOLIC PANEL WITH GFR    EKG: None  Radiology: No results found.  {Document cardiac monitor, telemetry  assessment procedure when appropriate:32947} Procedures   Medications Ordered in the ED - No data to display    {Click here for ABCD2, HEART and other calculators REFRESH Note before signing:1}                               48yo male presents with concern for flank pain.  DDx includes appendicitis, pancreatitis, cholecystitis, pyelonephritis, nephrolithiasis, diverticulitis, SBO, msk pain.   Labs evaluated and personally interpreted by me show no evidence of urinary tract infection.  No signs of pancreatitis.  No transaminitis.  No leukocytosis.  Very mild anemia.  Ordered GC/chlamydia in the setting of dysuria.  Will also order HIV and syphilis for screening purposes.  Given abdominal tenderness, CT abdomen pelvis was ordered and shows***    {Document critical care time when appropriate  Document review of labs and clinical decision tools ie [id number], etc  Document your independent review of radiology images and any outside records  Document your discussion with family members, caretakers and with consultants  Document social determinants of health affecting pt's care  Document your decision making why or why not admission, treatments were needed:32947:::1}   Final diagnoses:  None    ED Discharge Orders     None

## 2024-08-13 NOTE — ED Triage Notes (Signed)
 Pt c/o BL flank pain and intermittent swelling x 2 days. Home uti test was + today. LBM last night, normal.   Seen previously for rash on L arm, received Rx for Triamcinolone , requesting higher dosage.

## 2024-08-14 LAB — HIV ANTIBODY (ROUTINE TESTING W REFLEX): HIV Screen 4th Generation wRfx: NONREACTIVE

## 2024-08-14 LAB — RPR: RPR Ser Ql: NONREACTIVE

## 2024-08-15 LAB — GC/CHLAMYDIA PROBE AMP (~~LOC~~) NOT AT ARMC
Chlamydia: NEGATIVE
Comment: NEGATIVE
Comment: NORMAL
Neisseria Gonorrhea: NEGATIVE
